# Patient Record
Sex: Female | Born: 1980 | Race: White | Hispanic: No | Marital: Married | Smoking: Never smoker
Health system: Southern US, Community
[De-identification: ages and names within clinical notes are randomized; demographics above are authoritative.]

## PROBLEM LIST (undated history)

## (undated) DIAGNOSIS — O00109 Unspecified tubal pregnancy without intrauterine pregnancy: Secondary | ICD-10-CM

## (undated) HISTORY — PX: TUBAL LIGATION: SHX77

## (undated) HISTORY — DX: Unspecified tubal pregnancy without intrauterine pregnancy: O00.109

---

## 2015-02-01 ENCOUNTER — Emergency Department (HOSPITAL_COMMUNITY)
Admission: EM | Admit: 2015-02-01 | Discharge: 2015-02-01 | Disposition: A | Discharge status: Home / Self Care | Payer: Self-pay | Attending: Emergency Medicine | Admitting: Emergency Medicine

## 2015-02-01 ENCOUNTER — Encounter (HOSPITAL_COMMUNITY): Payer: Self-pay | Admitting: Emergency Medicine

## 2015-02-01 ENCOUNTER — Emergency Department (HOSPITAL_COMMUNITY): Payer: Self-pay

## 2015-02-01 DIAGNOSIS — Y9289 Other specified places as the place of occurrence of the external cause: Secondary | ICD-10-CM | POA: Insufficient documentation

## 2015-02-01 DIAGNOSIS — W1842XA Slipping, tripping and stumbling without falling due to stepping into hole or opening, initial encounter: Secondary | ICD-10-CM | POA: Insufficient documentation

## 2015-02-01 DIAGNOSIS — Y9389 Activity, other specified: Secondary | ICD-10-CM | POA: Insufficient documentation

## 2015-02-01 DIAGNOSIS — Y990 Civilian activity done for income or pay: Secondary | ICD-10-CM | POA: Insufficient documentation

## 2015-02-01 DIAGNOSIS — S93401A Sprain of unspecified ligament of right ankle, initial encounter: Secondary | ICD-10-CM | POA: Insufficient documentation

## 2015-02-01 MED ORDER — ONDANSETRON HCL 4 MG PO TABS
4.0000 mg | ORAL_TABLET | Freq: Once | ORAL | Status: AC
  Administered 2015-02-01: 4 mg via ORAL
  Filled 2015-02-01: qty 1

## 2015-02-01 MED ORDER — HYDROCODONE-ACETAMINOPHEN 5-325 MG PO TABS: 1.0000 | ORAL_TABLET | ORAL | Status: DC | PRN

## 2015-02-01 MED ORDER — IBUPROFEN 800 MG PO TABS: 800.0000 mg | ORAL_TABLET | Freq: Three times a day (TID) | ORAL | Status: DC

## 2015-02-01 MED ORDER — HYDROCODONE-ACETAMINOPHEN 5-325 MG PO TABS
2.0000 | ORAL_TABLET | Freq: Once | ORAL | Status: AC
  Administered 2015-02-01: 2 via ORAL
  Filled 2015-02-01: qty 2

## 2015-02-01 MED ORDER — IBUPROFEN 800 MG PO TABS
800.0000 mg | ORAL_TABLET | Freq: Once | ORAL | Status: AC
  Administered 2015-02-01: 800 mg via ORAL
  Filled 2015-02-01: qty 1

## 2015-02-01 NOTE — Discharge Instructions (Signed)
Please apply ice to your right ankle. These keep your ankle elevated above your waist. Use crutches until you can safely apply weight to your right lower extremity. Use ibuprofen 3 times daily with food. May use Norco for more severe pain. Norco may cause drowsiness, please use this medication with caution. Please see Dr. Romeo AppleHarrison for additional evaluation if your ankle is not improving. Please use your ankle stirrup splint for the next 10 days. You do not have to sleep in this device, but it should be worn when up and about. Ankle Sprain An ankle sprain is an injury to the strong, fibrous tissues (ligaments) that hold the bones of your ankle joint together.  CAUSES An ankle sprain is usually caused by a fall or by twisting your ankle. Ankle sprains most commonly occur when you step on the outer edge of your foot, and your ankle turns inward. People who participate in sports are more prone to these types of injuries.  SYMPTOMS   Pain in your ankle. The pain may be present at rest or only when you are trying to stand or walk.  Swelling.  Bruising. Bruising may develop immediately or within 1 to 2 days after your injury.  Difficulty standing or walking, particularly when turning corners or changing directions. DIAGNOSIS  Your caregiver will ask you details about your injury and perform a physical exam of your ankle to determine if you have an ankle sprain. During the physical exam, your caregiver will press on and apply pressure to specific areas of your foot and ankle. Your caregiver will try to move your ankle in certain ways. An X-ray exam may be done to be sure a bone was not broken or a ligament did not separate from one of the bones in your ankle (avulsion fracture).  TREATMENT  Certain types of braces can help stabilize your ankle. Your caregiver can make a recommendation for this. Your caregiver may recommend the use of medicine for pain. If your sprain is severe, your caregiver may refer you  to a surgeon who helps to restore function to parts of your skeletal system (orthopedist) or a physical therapist. HOME CARE INSTRUCTIONS   Apply ice to your injury for 1-2 days or as directed by your caregiver. Applying ice helps to reduce inflammation and pain.  Put ice in a plastic bag.  Place a towel between your skin and the bag.  Leave the ice on for 15-20 minutes at a time, every 2 hours while you are awake.  Only take over-the-counter or prescription medicines for pain, discomfort, or fever as directed by your caregiver.  Elevate your injured ankle above the level of your heart as much as possible for 2-3 days.  If your caregiver recommends crutches, use them as instructed. Gradually put weight on the affected ankle. Continue to use crutches or a cane until you can walk without feeling pain in your ankle.  If you have a plaster splint, wear the splint as directed by your caregiver. Do not rest it on anything harder than a pillow for the first 24 hours. Do not put weight on it. Do not get it wet. You may take it off to take a shower or bath.  You may have been given an elastic bandage to wear around your ankle to provide support. If the elastic bandage is too tight (you have numbness or tingling in your foot or your foot becomes cold and blue), adjust the bandage to make it comfortable.  If you  have an air splint, you may blow more air into it or let air out to make it more comfortable. You may take your splint off at night and before taking a shower or bath. Wiggle your toes in the splint several times per day to decrease swelling. SEEK MEDICAL CARE IF:   You have rapidly increasing bruising or swelling.  Your toes feel extremely cold or you lose feeling in your foot.  Your pain is not relieved with medicine. SEEK IMMEDIATE MEDICAL CARE IF:  Your toes are numb or blue.  You have severe pain that is increasing. MAKE SURE YOU:   Understand these instructions.  Will watch  your condition.  Will get help right away if you are not doing well or get worse. Document Released: 07/14/2005 Document Revised: 04/07/2012 Document Reviewed: 07/26/2011 [facility] Patient Information 2015 [place], [place]. This information is not intended to replace advice given to you by your health care provider. Make sure you discuss any questions you have with your health care provider.

## 2015-02-01 NOTE — ED Provider Notes (Signed)
CSN: [id number]     Arrival date & time 02/01/15  1903 History   None    Chief Complaint  Patient presents with  . Ankle Pain     (Consider location/radiation/quality/duration/timing/severity/associated sxs/prior Treatment) HPI Comments: Patient is a 34 year old female who presents to the emergency department with a complaint of right ankle pain.  The patient states that she received some bad news about her grandmother today. She was rushing to get out of her workplace, when she stepped in a hole and was twisted her right ankle. She did not fall, but had a near fall. She has had increasing pain since that time. She now has severe pain when she attempts to put weight on the right lower extremity. There's been no previous operations or procedures involving the right lower extremity. The patient denies any bleeding disorders, or being on any anticoagulation medications. She has tried Tylenol without improvement in her pain.  Patient is a 34 y.o. female presenting with ankle pain. The history is provided by the patient.  Ankle Pain Associated symptoms: no back pain and no neck pain     History reviewed. No pertinent past medical history. Past Surgical History  Procedure Laterality Date  . Tubal ligation     No family history on file. History  Substance Use Topics  . Smoking status: Never Smoker   . Smokeless tobacco: Not on file  . Alcohol Use: No   OB History    No data available     Review of Systems  Constitutional: Negative for activity change.       All ROS Neg except as noted in HPI  HENT: Negative for nosebleeds.   Eyes: Negative for photophobia and discharge.  Respiratory: Negative for cough, shortness of breath and wheezing.   Cardiovascular: Negative for chest pain and palpitations.  Gastrointestinal: Negative for abdominal pain and blood in stool.  Genitourinary: Negative for dysuria, frequency and hematuria.  Musculoskeletal: Negative for back pain, arthralgias and  neck pain.  Skin: Negative.   Neurological: Negative for dizziness, seizures and speech difficulty.  Psychiatric/Behavioral: Negative for hallucinations and confusion.      Allergies  Review of patient's allergies indicates not on file.  Home Medications   Prior to Admission medications   Not on File   BP 107/78 mmHg  Pulse 96  Temp(Src) 98.6 F (37 C) (Oral)  Ht  (1.676 m)  Wt 260 lb (117.935 kg)  BMI 41.99 kg/m2  SpO2 100%  LMP 01/25/2015 (Approximate) Physical Exam  Constitutional: She is oriented to person, place, and time. She appears well-developed and well-nourished.  Non-toxic appearance.  HENT:  Head: Normocephalic.  Right Ear: Tympanic membrane and external ear normal.  Left Ear: Tympanic membrane and external ear normal.  Eyes: EOM and lids are normal. Pupils are equal, round, and reactive to light.  Neck: Normal range of motion. Neck supple. Carotid bruit is not present.  Cardiovascular: Normal rate, regular rhythm, normal heart sounds, intact distal pulses and normal pulses.   Pulmonary/Chest: Breath sounds normal. No respiratory distress.  Abdominal: Soft. Bowel sounds are normal. There is no tenderness. There is no guarding.  Musculoskeletal: Normal range of motion.  There is good range of motion of the right hip and knee. There is no deformity of the tibia area. The Achilles tendon is intact. The dorsalis pedis pulses 2+. Capillary refill is less than 2 seconds. There is lateral and medial tenderness to palpation. There is no palpable deformity. There no puncture wounds  to the plantar surface of the right foot. There no color changes or temperature changes.  Lymphadenopathy:       Head (right side): No submandibular adenopathy present.       Head (left side): No submandibular adenopathy present.    She has no cervical adenopathy.  Neurological: She is alert and oriented to person, place, and time. She has normal strength. No cranial nerve deficit or  sensory deficit.  Skin: Skin is warm and dry.  Psychiatric: She has a normal mood and affect. Her speech is normal.  Nursing note and vitals reviewed.   ED Course  Procedures (including critical care time) Labs Review Labs Reviewed - No data to display  Imaging Review Dg Ankle Complete Right  02/01/2015   CLINICAL DATA:  Pain following twisting injury  EXAM: RIGHT ANKLE - COMPLETE 3+ VIEW  COMPARISON:  None.  FINDINGS: Frontal, oblique, and lateral views were obtained. There is generalized soft tissue swelling. There is no demonstrable fracture or joint effusion. The ankle mortise appears intact. No appreciable joint space narrowing.  IMPRESSION: Soft tissue swelling.  No demonstrable fracture.  Mortise intact.   Electronically Signed   By: Bretta BangWilliam  Woodruff III M.D.   On: 02/01/2015 19:50     EKG Interpretation None      MDM  X-ray of the right ankle is negative for fracture or dislocation. There are some mild to moderate soft tissue swelling areas. The vital signs have been reviewed. The patient will be treated with an ankle stirrup splint and crutches. The patient is provided an ice pack, and asked to keep the foot and ankle elevated above her waist. The patient will be treated with ibuprofen and Norco for pain. The patient is to follow-up with Dr. Romeo AppleHarrison for additional evaluation and management if not improving. Work note provided.    Final diagnoses:  None    *I have reviewed nursing notes, vital signs, and all appropriate lab and imaging results for this patient.[address]**    Nikeya Maxim, PA-C 02/01/15 2029  Rolland PorterMark James, MD 02/11/15 [phone]

## 2015-02-01 NOTE — ED Notes (Signed)
Pt. Report twisting right ankle at work. No obvious deformity noted.

## 2015-06-01 ENCOUNTER — Emergency Department (HOSPITAL_COMMUNITY)
Admission: EM | Admit: 2015-06-01 | Discharge: 2015-06-01 | Disposition: A | Discharge status: Home / Self Care | Payer: Self-pay | Attending: Emergency Medicine | Admitting: Emergency Medicine

## 2015-06-01 ENCOUNTER — Emergency Department (HOSPITAL_COMMUNITY): Payer: Self-pay

## 2015-06-01 ENCOUNTER — Encounter (HOSPITAL_COMMUNITY): Payer: Self-pay | Admitting: Emergency Medicine

## 2015-06-01 DIAGNOSIS — Y9389 Activity, other specified: Secondary | ICD-10-CM | POA: Insufficient documentation

## 2015-06-01 DIAGNOSIS — X503XXA Overexertion from repetitive movements, initial encounter: Secondary | ICD-10-CM | POA: Insufficient documentation

## 2015-06-01 DIAGNOSIS — Z3202 Encounter for pregnancy test, result negative: Secondary | ICD-10-CM | POA: Insufficient documentation

## 2015-06-01 DIAGNOSIS — Y9289 Other specified places as the place of occurrence of the external cause: Secondary | ICD-10-CM | POA: Insufficient documentation

## 2015-06-01 DIAGNOSIS — Y998 Other external cause status: Secondary | ICD-10-CM | POA: Insufficient documentation

## 2015-06-01 DIAGNOSIS — Z791 Long term (current) use of non-steroidal anti-inflammatories (NSAID): Secondary | ICD-10-CM | POA: Insufficient documentation

## 2015-06-01 DIAGNOSIS — S39012A Strain of muscle, fascia and tendon of lower back, initial encounter: Secondary | ICD-10-CM | POA: Insufficient documentation

## 2015-06-01 LAB — POC URINE PREG, ED: Preg Test, Ur: NEGATIVE

## 2015-06-01 MED ORDER — DIAZEPAM 5 MG PO TABS
10.0000 mg | ORAL_TABLET | Freq: Once | ORAL | Status: AC
  Administered 2015-06-01: 10 mg via ORAL
  Filled 2015-06-01: qty 2

## 2015-06-01 MED ORDER — BACLOFEN 10 MG PO TABS: 10.0000 mg | ORAL_TABLET | Freq: Three times a day (TID) | ORAL | Status: AC

## 2015-06-01 MED ORDER — IBUPROFEN 800 MG PO TABS: 800.0000 mg | ORAL_TABLET | Freq: Three times a day (TID) | ORAL | Status: DC

## 2015-06-01 MED ORDER — PROMETHAZINE HCL 12.5 MG PO TABS
12.5000 mg | ORAL_TABLET | Freq: Once | ORAL | Status: AC
  Administered 2015-06-01: 12.5 mg via ORAL
  Filled 2015-06-01: qty 1

## 2015-06-01 MED ORDER — KETOROLAC TROMETHAMINE 10 MG PO TABS
10.0000 mg | ORAL_TABLET | Freq: Once | ORAL | Status: AC
  Administered 2015-06-01: 10 mg via ORAL
  Filled 2015-06-01: qty 1

## 2015-06-01 MED ORDER — DEXAMETHASONE 4 MG PO TABS: 4.0000 mg | ORAL_TABLET | Freq: Two times a day (BID) | ORAL | Status: DC

## 2015-06-01 NOTE — ED Provider Notes (Signed)
CSN: [id number]     Arrival date & time 06/01/15  [id number] History  By signing my name below, I, Teresa Gordon, attest that this documentation has been prepared under the direction and in the presence of Teresa Gordon Lorris Carducci, PA-C. Electronically Signed: Ronney LionSuzanne Gordon, ED Scribe. 06/01/2015. 10:16 AM.     Chief Complaint  Patient presents with  . Back Pain   Patient is a 34 y.o. female presenting with back pain. The history is provided by the patient. No language interpreter was used.  Back Pain Location:  Lumbar spine Pain severity:  Moderate Onset quality:  Gradual Timing:  Constant Progression:  Worsening Chronicity:  New Context: lifting heavy objects   Relieved by:  None tried Worsened by:  Nothing tried Ineffective treatments:  None tried Associated symptoms: leg pain (back pain radiates down BLE)   Associated symptoms: no bladder incontinence and no bowel incontinence     HPI Comments: Teresa Gordon is a 34 y.o. female who presents to the Emergency Department complaining of gradual-onset, constant, moderate bilateral lower back pain that radiates down her bilateral legs, with onset 2 days ago. Patient does repetitive lifting, pushing, and stands for extended periods of time. She denies any previous operations on the back. She denies loss of bowel or bladder function. She has tried Tylenol with no relief to her pain.  History reviewed. No pertinent past medical history. Past Surgical History  Procedure Laterality Date  . Tubal ligation     No family history on file. Social History  Substance Use Topics  . Smoking status: Never Smoker   . Smokeless tobacco: None  . Alcohol Use: No   OB History    No data available     Review of Systems  Gastrointestinal: Negative for bowel incontinence.  Genitourinary: Negative for bladder incontinence.  Musculoskeletal: Positive for back pain.  All other systems reviewed and are negative.  Allergies  Review of patient's allergies indicates no  known allergies.  Home Medications   Prior to Admission medications   Medication Sig Start Date End Date Taking? Authorizing Provider  HYDROcodone-acetaminophen (NORCO/VICODIN) 5-325 MG per tablet Take 1 tablet by mouth every 4 (four) hours as needed. 02/01/15   Teresa Gordon Gates Jividen, PA-C  ibuprofen (ADVIL,MOTRIN) 800 MG tablet Take 1 tablet (800 mg total) by mouth 3 (three) times daily. 02/01/15   Teresa Gordon Sairah Knobloch, PA-C   BP 107/52 mmHg  Pulse 88  Temp(Src) 98.1 F (36.7 C) (Oral)  Resp 18  Ht 5\' 6"  (1.676 m)  Wt 260 lb (117.935 kg)  BMI 41.99 kg/m2  SpO2 100%  LMP 04/28/2015 Physical Exam  Constitutional: She is oriented to person, place, and time. She appears well-developed and well-nourished.  Non-toxic appearance. No distress.  HENT:  Head: Normocephalic and atraumatic.  Right Ear: Tympanic membrane and external ear normal.  Left Ear: Tympanic membrane and external ear normal.  Eyes: Conjunctivae, EOM and lids are normal. Pupils are equal, round, and reactive to light.  Neck: Normal range of motion. Neck supple. Carotid bruit is not present. No tracheal deviation present.  Cardiovascular: Normal rate, regular rhythm, normal heart sounds, intact distal pulses and normal pulses.   Pulmonary/Chest: Effort normal and breath sounds normal. No respiratory distress.  Abdominal: Soft. Bowel sounds are normal. There is no tenderness. There is no guarding.  Musculoskeletal: Normal range of motion.       Lumbar back: She exhibits pain and spasm.       Back:  Lymphadenopathy:  Head (right side): No submandibular adenopathy present.       Head (left side): No submandibular adenopathy present.    She has no cervical adenopathy.  Neurological: She is alert and oriented to person, place, and time. She has normal strength. No cranial nerve deficit or sensory deficit.  Gait is slow but steady. No foot drop. No motor or sensory deficit  Skin: Skin is warm and dry.  Psychiatric: She has a normal  mood and affect. Her speech is normal and behavior is normal.  Nursing note and vitals reviewed.   ED Course  Procedures (including critical care time)  DIAGNOSTIC STUDIES: Oxygen Saturation is 100% on RA, normal by my interpretation.    COORDINATION OF CARE: 10:10 AM - Discussed treatment plan with pt at bedside which includes L-spine XR and pain management here in the ED. Pt verbalized understanding and agreed to plan.   Labs Review Labs Reviewed  POC URINE PREG, ED    Imaging Review No results found. I have personally reviewed and evaluated these images and lab results as part of my medical decision-making.   MDM  Xray of the L spine is negative. No gross neuro deficit noted. Pt to be treated with baclofen, ibuprofen, and decadron.    Final diagnoses:  None    **I personally performed the services described in this documentation, which was scribed in my presence. The recorded information has been reviewed and is accurate.* I have reviewed nursing notes, vital signs, and all appropriate lab and imaging results for this patient.   Teresa Quale, PA-C 06/01/15 1052  Glynn Octave, MD 06/01/15 [phone]

## 2015-06-01 NOTE — Discharge Instructions (Signed)
Your xray is negative for fracture or changes in the disc space. Please rest your back as much as possible. Heating pad may be helpful. Use baclofen, ibuprofen, and decadron daily with food. Use Baclofen with caution Lumbosacral Strain Lumbosacral strain is a strain of any of the parts that make up your lumbosacral vertebrae. Your lumbosacral vertebrae are the bones that make up the lower third of your backbone. Your lumbosacral vertebrae are held together by muscles and tough, fibrous tissue (ligaments).  CAUSES  A sudden blow to your back can cause lumbosacral strain. Also, anything that causes an excessive stretch of the muscles in the low back can cause this strain. This is typically seen when people exert themselves strenuously, fall, lift heavy objects, bend, or crouch repeatedly. RISK FACTORS  Physically demanding work.  Participation in pushing or pulling sports or sports that require a sudden twist of the back (tennis, golf, baseball).  Weight lifting.  Excessive lower back curvature.  Forward-tilted pelvis.  Weak back or abdominal muscles or both.  Tight hamstrings. SIGNS AND SYMPTOMS  Lumbosacral strain may cause pain in the area of your injury or pain that moves (radiates) down your leg.  DIAGNOSIS Your health care provider can often diagnose lumbosacral strain through a physical exam. In some cases, you may need tests such as X-ray exams.  TREATMENT  Treatment for your lower back injury depends on many factors that your clinician will have to evaluate. However, most treatment will include the use of anti-inflammatory medicines. HOME CARE INSTRUCTIONS   Avoid hard physical activities (tennis, racquetball, waterskiing) if you are not in proper physical condition for it. This may aggravate or create problems.  If you have a back problem, avoid sports requiring sudden body movements. Swimming and walking are generally safer activities.  Maintain good posture.  Maintain a  healthy weight.  For acute conditions, you may put ice on the injured area.  Put ice in a plastic bag.  Place a towel between your skin and the bag.  Leave the ice on for 20 minutes, 2-3 times a day.  When the low back starts healing, stretching and strengthening exercises may be recommended. SEEK MEDICAL CARE IF:  Your back pain is getting worse.  You experience severe back pain not relieved with medicines. SEEK IMMEDIATE MEDICAL CARE IF:   You have numbness, tingling, weakness, or problems with the use of your arms or legs.  There is a change in bowel or bladder control.  You have increasing pain in any area of the body, including your belly (abdomen).  You notice shortness of breath, dizziness, or feel faint.  You feel sick to your stomach (nauseous), are throwing up (vomiting), or become sweaty.  You notice discoloration of your toes or legs, or your feet get very cold. MAKE SURE YOU:   Understand these instructions.  Will watch your condition.  Will get help right away if you are not doing well or get worse.   This information is not intended to replace advice given to you by your health care provider. Make sure you discuss any questions you have with your health care provider.   Document Released: 04/23/2005 Document Revised: 08/04/2014 Document Reviewed: 03/02/2013 Elsevier Interactive Patient Education [employer]2016 [employer] , This medication may cause drowsiness. Please do not drink, drive, or participate in activity that requires concentration while taking this medication.

## 2015-06-01 NOTE — ED Notes (Signed)
PA at bedside.

## 2015-06-01 NOTE — ED Notes (Signed)
Pt reports bilateral lower back pain that radiates to her legs. Pt states she may have injured it at work. States pain increases with movement. Pt ambulatory.

## 2016-03-02 IMAGING — DX DG LUMBAR SPINE COMPLETE 4+V
5 series · 5 of 5 positions shown · non-contrast
Comparison: None.

CLINICAL DATA: Lumbago and radicular pain of both lower
extremities, left greater than right.

EXAM:
LUMBAR SPINE - COMPLETE 4+ VIEW

[l-spine ap]
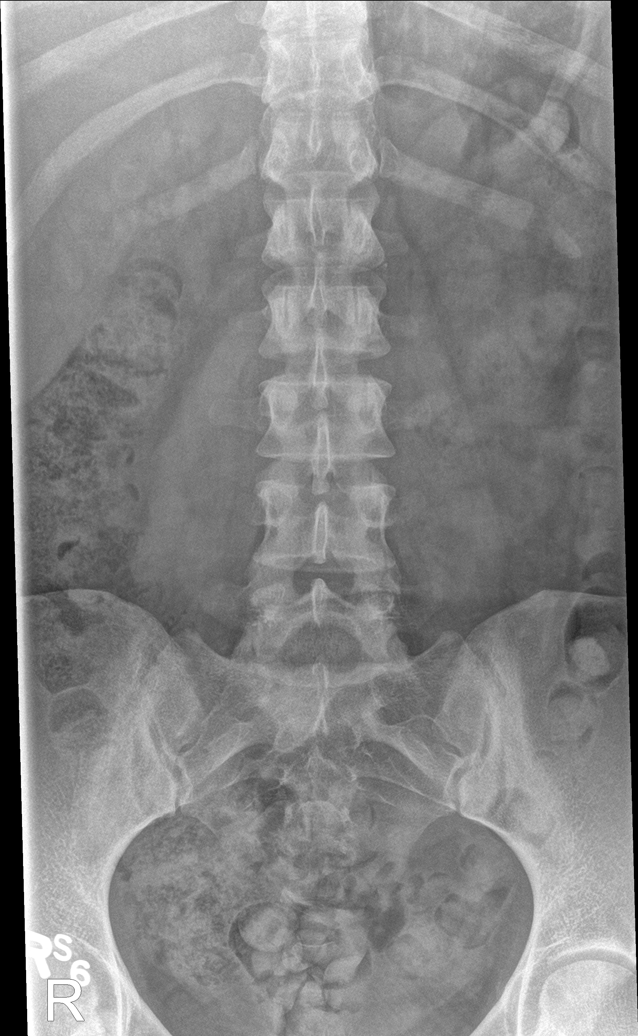

[l-spine obl (1 of 2)]
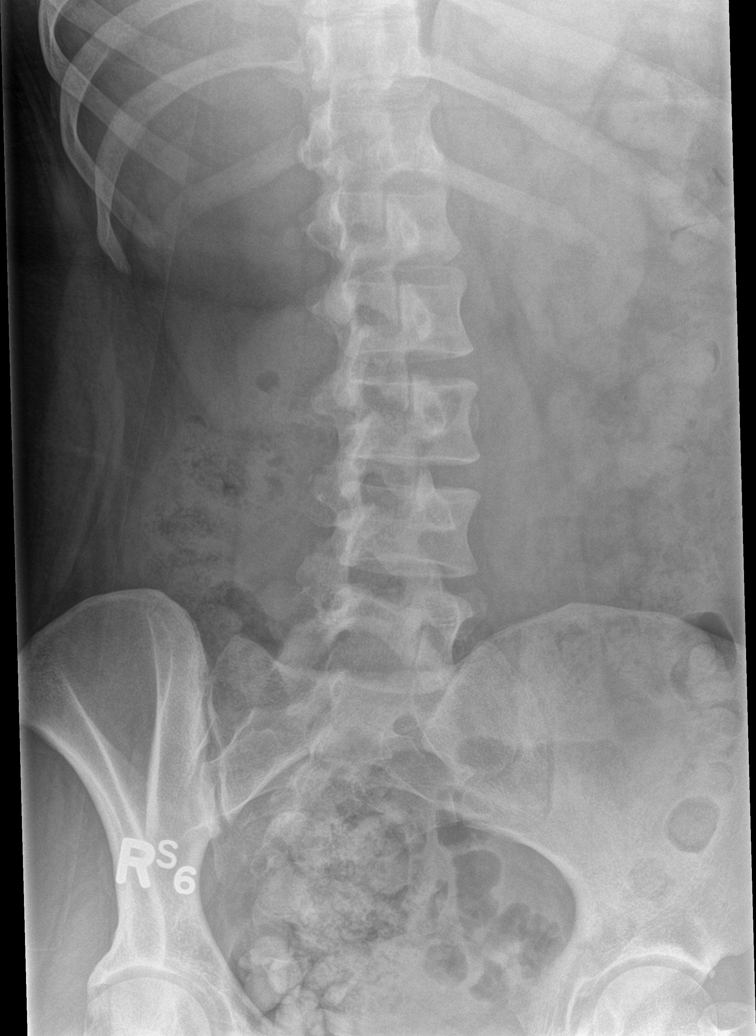

[l-spine obl (2 of 2)]
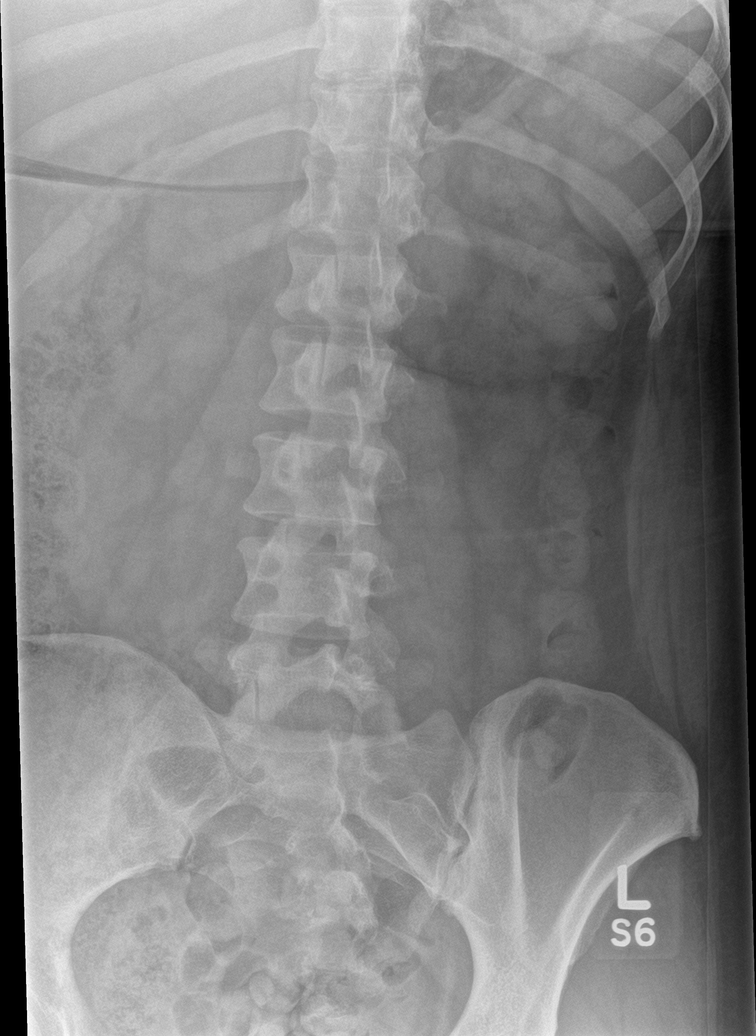

[l-spine lat]
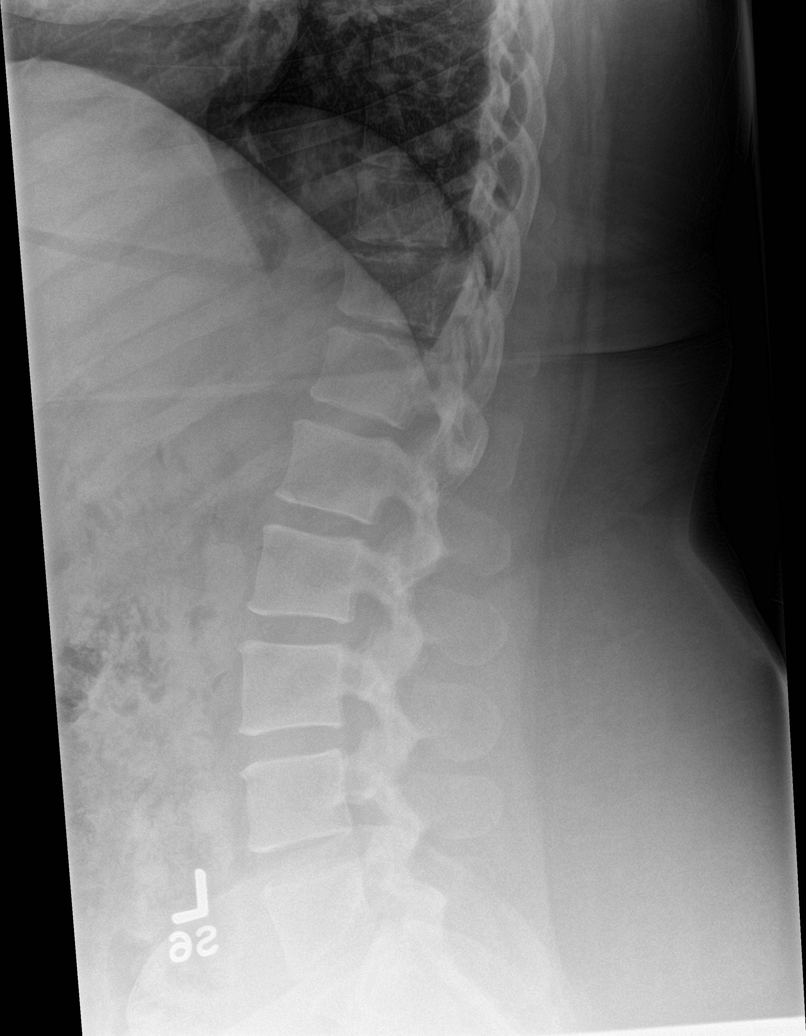

[l-spine spot]
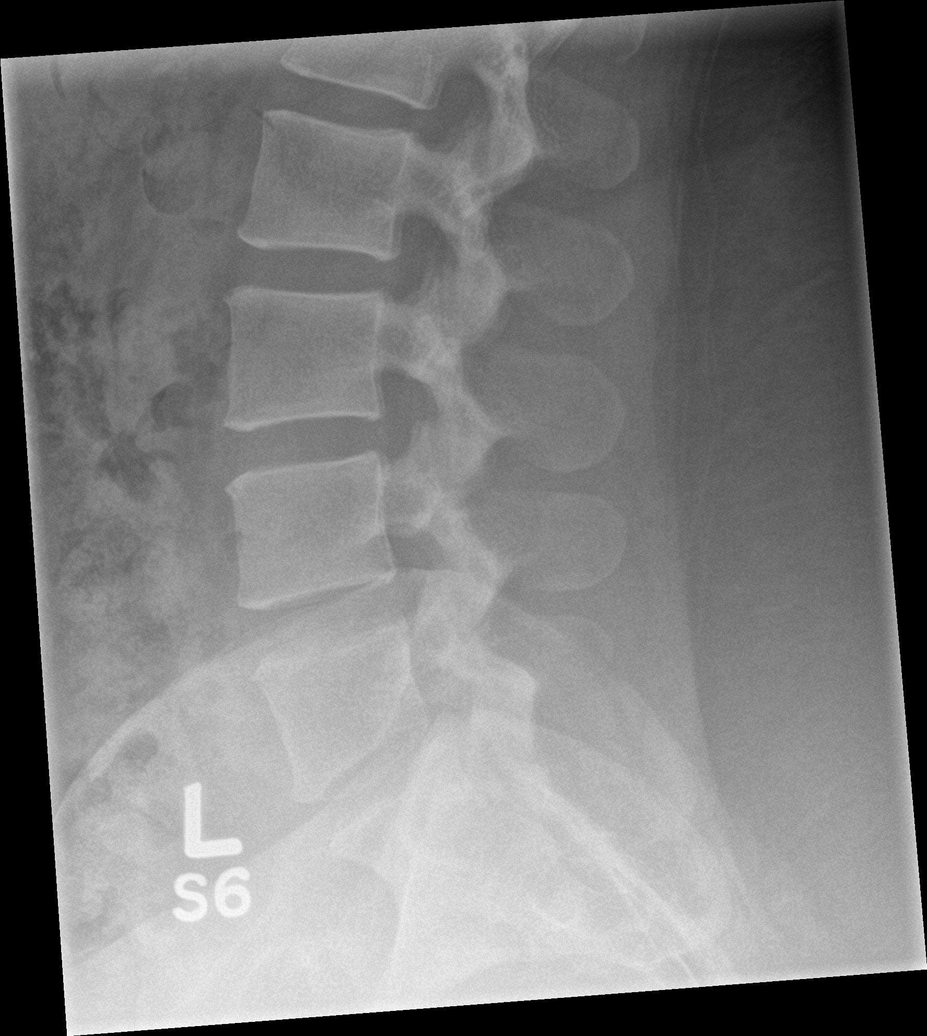

[5 of 5 positions shown; findings below may reference images not displayed]

FINDINGS: There is no evidence of lumbar spine fracture or subluxation.
Alignment is normal. Intervertebral disc spaces are maintained. No
significant spondylosis identified. No bony lesions seen.
IMPRESSION: Normal lumbar spine radiographs.

## 2016-05-28 DIAGNOSIS — O00109 Unspecified tubal pregnancy without intrauterine pregnancy: Secondary | ICD-10-CM

## 2016-05-28 HISTORY — PX: BILATERAL SALPINGECTOMY: SHX5743

## 2016-05-28 HISTORY — DX: Unspecified tubal pregnancy without intrauterine pregnancy: O00.109

## 2016-11-25 ENCOUNTER — Telehealth: Payer: Self-pay | Admitting: *Deleted

## 2016-11-25 NOTE — Telephone Encounter (Signed)
Patient called with complaints of sharp pain radiating from her back to lower front which is relieved by Ibuprofen when taken.  She has a history of an ectopic pregnancy in 05/2016. Advised to be seen for pap/physical since she has not been seen in several years. Pt verbalized understanding. Will schedule for Thursday.

## 2016-11-27 ENCOUNTER — Encounter (INDEPENDENT_AMBULATORY_CARE_PROVIDER_SITE_OTHER): Payer: Self-pay

## 2016-11-27 ENCOUNTER — Other Ambulatory Visit (HOSPITAL_COMMUNITY)
Admission: RE | Admit: 2016-11-27 | Discharge: 2016-11-27 | Disposition: A | Discharge status: Home / Self Care | Payer: BLUE CROSS/BLUE SHIELD | Source: Ambulatory Visit | Attending: Adult Health | Admitting: Adult Health

## 2016-11-27 ENCOUNTER — Encounter: Payer: Self-pay | Admitting: Adult Health

## 2016-11-27 ENCOUNTER — Ambulatory Visit (INDEPENDENT_AMBULATORY_CARE_PROVIDER_SITE_OTHER): Payer: BLUE CROSS/BLUE SHIELD | Admitting: Adult Health

## 2016-11-27 VITALS — BP 126/78 | HR 100 | Ht 66.0 in | Wt 272.0 lb

## 2016-11-27 DIAGNOSIS — R1031 Right lower quadrant pain: Secondary | ICD-10-CM | POA: Insufficient documentation

## 2016-11-27 DIAGNOSIS — Z01419 Encounter for gynecological examination (general) (routine) without abnormal findings: Secondary | ICD-10-CM | POA: Insufficient documentation

## 2016-11-27 DIAGNOSIS — F329 Major depressive disorder, single episode, unspecified: Secondary | ICD-10-CM

## 2016-11-27 DIAGNOSIS — F32A Depression, unspecified: Secondary | ICD-10-CM | POA: Insufficient documentation

## 2016-11-27 MED ORDER — ESCITALOPRAM OXALATE 10 MG PO TABS: 10.0000 mg | ORAL_TABLET | Freq: Every day | ORAL | 6 refills | Status: DC

## 2016-11-27 NOTE — Progress Notes (Signed)
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  She was seen at urgent care yesterday for the pain and is better today. She says she and her husband were saving for tubal reversal and then when had ectopic both tubes were removed. She works at [employer] in [place] in [employer] [place].No current PCP,but has seen Dr Margo Commonapper in the past.    Current Medications, Allergies, Past Medical History, Past Surgical History, Family History and Social History were reviewed in [facility] electronic medical record.     Review of Systems: Patient denies any daily headaches, hearing loss, fatigue, blurred vision, shortness of breath, chest pain, problems with bowel movements, urination, or intercourse. No joint pain or mood swings.Periods regular. See HPI for positives.   Physical Exam:BP 126/78 (BP Location: Right Arm, Patient Position: Sitting, Cuff Size: Large)   Pulse 100   Ht 5\' 6"  (1.676 m)   Wt 272 lb (123.4 kg)   LMP 11/09/2016 (Approximate)   BMI 43.90 kg/m  General:  Well developed, well nourished, no acute distress Skin:  Warm and dry Neck:  Midline trachea, normal thyroid, good ROM, no lymphadenopathy Lungs; Clear to auscultation bilaterally Breast:  No dominant palpable mass, retraction, or nipple discharge Cardiovascular: Regular rate and rhythm Abdomen:  Soft, non tender, no hepatosplenomegaly Pelvic:  External genitalia is normal in appearance, no lesions.  The vagina is normal in appearance. Urethra has no lesions or masses. The cervix is bulbous.Pap with HPV performed. +ovulation mucous. Uterus is felt to be normal size, shape, and contour.  No adnexal masses or tenderness noted.Bladder is non  tender, no masses felt. Extremities/musculoskeletal:  No swelling or varicosities noted, no clubbing or cyanosis Psych:  No mood changes, alert and cooperative,seems happy PHQ 9 score 8, denies being suicidal, and is open to meds, not counseling at this time.Took zoloft years ago and it made depression worse.  Discussed pain could have been cyst, will get US after period, also talked about IVF or adoption and she is leaning toward adoption, she has strong faith in God.   Impression:  1. Encounter for gynecological examination with Papanicolaou smear of cervix   2. RLQ abdominal pain   3. Depression, unspecified depression type      Plan: Meds ordered this encounter  Medications  . escitalopram (LEXAPRO) 10 MG tablet    Sig: Take 1 tablet (10 mg total) by mouth daily.    Dispense:  30 tablet    Refill:  6    Order Specific Question:   Supervising Provider    Answer:   Duane LopeEURE, LUTHER H [[id number]]  Return in 2 weeks for GYN US See me in 5 weeks in F/U and labs Physical in 1 year Pap in 3 if normal       Patient ID: Teresa Gordon, female   DOB: 02/24/1981, 36 y.o.   MRN: [id number]

## 2016-12-01 LAB — CYTOLOGY - PAP
DIAGNOSIS: NEGATIVE
HPV: NOT DETECTED

## 2016-12-15 ENCOUNTER — Ambulatory Visit (INDEPENDENT_AMBULATORY_CARE_PROVIDER_SITE_OTHER): Payer: BLUE CROSS/BLUE SHIELD

## 2016-12-15 DIAGNOSIS — R1031 Right lower quadrant pain: Secondary | ICD-10-CM | POA: Diagnosis not present

## 2016-12-15 NOTE — Progress Notes (Signed)
PELVIC US TA/TV: homogeneous anteverted uterus,wnl,EEC 12.7 mm,normal ovaries bilat,ovaries appear mobile,no free fluid,no pain during ultrasound

## 2016-12-18 ENCOUNTER — Telehealth: Payer: Self-pay | Admitting: Adult Health

## 2016-12-18 NOTE — Telephone Encounter (Signed)
Tried to call about US, number invalid

## 2017-01-05 ENCOUNTER — Encounter: Payer: Self-pay | Admitting: Adult Health

## 2017-01-05 ENCOUNTER — Ambulatory Visit: Payer: BLUE CROSS/BLUE SHIELD | Admitting: Adult Health

## 2017-01-05 ENCOUNTER — Telehealth: Payer: Self-pay | Admitting: Adult Health

## 2017-01-05 NOTE — Telephone Encounter (Signed)
Will send letter that US was normal and make F/U appt on meds.

## 2017-03-09 ENCOUNTER — Ambulatory Visit (INDEPENDENT_AMBULATORY_CARE_PROVIDER_SITE_OTHER): Payer: BLUE CROSS/BLUE SHIELD | Admitting: Adult Health

## 2017-03-09 ENCOUNTER — Encounter: Payer: Self-pay | Admitting: Adult Health

## 2017-03-09 VITALS — BP 120/72 | HR 75 | Ht 66.0 in | Wt 273.5 lb

## 2017-03-09 DIAGNOSIS — Z319 Encounter for procreative management, unspecified: Secondary | ICD-10-CM

## 2017-03-09 DIAGNOSIS — F329 Major depressive disorder, single episode, unspecified: Secondary | ICD-10-CM

## 2017-03-09 DIAGNOSIS — F32A Depression, unspecified: Secondary | ICD-10-CM

## 2017-03-09 NOTE — Progress Notes (Signed)
Subjective:     Patient ID: Shaaron AdlerMichelle Rammel, female   DOB: 05/10/1981, 36 y.o.   MRN: [id number]  HPI Marcelino DusterMichelle is a 36 year old white female back in follow up of starting lexapro but stopped it due to being too sleepy and felt more depressed.  Review of Systems Depressed still Reviewed past medical,surgical, social and family history. Reviewed medications and allergies.     Objective:   Physical Exam BP 120/72 (BP Location: Left Arm, Patient Position: Sitting, Cuff Size: Large)   Pulse 75   Ht 5\' 6"  (1.676 m)   Wt 273 lb 8 oz (124.1 kg)   LMP 03/04/2017 (Approximate)   BMI 44.14 kg/m PHQ 9 score 13, denies being suicidal or homicidal and will call [facility] to talk. She doesn't think meds are for her.And she still wants to be pregnant, considering IVF.     Assessment:     Depression  Desires pregnancy    [facility]:     [facility], number given Follow up prn

## 2017-03-09 NOTE — Patient Instructions (Signed)
Call [facility] and [facility] Follow up prn

## 2017-11-30 ENCOUNTER — Other Ambulatory Visit: Payer: BLUE CROSS/BLUE SHIELD | Admitting: Adult Health

## 2017-11-30 ENCOUNTER — Encounter: Payer: Self-pay | Admitting: Adult Health
